# Patient Record
Sex: Female | Born: 1990 | Race: Black or African American | Hispanic: No | Marital: Single | Smoking: Never smoker
Health system: Southern US, Community
[De-identification: ages and names within clinical notes are randomized; demographics above are authoritative.]

## PROBLEM LIST (undated history)

## (undated) HISTORY — PX: SHOULDER SURGERY: SHX246

---

## 2016-09-18 ENCOUNTER — Emergency Department
Admission: EM | Admit: 2016-09-18 | Discharge: 2016-09-18 | Disposition: A | Discharge status: Home / Self Care | Payer: 59 | Attending: Emergency Medicine | Admitting: Emergency Medicine

## 2016-09-18 ENCOUNTER — Encounter: Payer: Self-pay | Admitting: Emergency Medicine

## 2016-09-18 DIAGNOSIS — B349 Viral infection, unspecified: Secondary | ICD-10-CM | POA: Diagnosis not present

## 2016-09-18 DIAGNOSIS — R05 Cough: Secondary | ICD-10-CM | POA: Diagnosis present

## 2016-09-18 MED ORDER — IBUPROFEN 600 MG PO TABS: 600.0000 mg | ORAL_TABLET | Freq: Four times a day (QID) | ORAL | 0 refills | Status: DC | PRN

## 2016-09-18 MED ORDER — PROMETHAZINE-CODEINE 6.25-10 MG/5ML PO SYRP: 5.0000 mL | ORAL_SOLUTION | Freq: Four times a day (QID) | ORAL | 0 refills | Status: AC | PRN

## 2016-09-18 NOTE — ED Notes (Signed)
See triage note.states she developed body aches about 4 days ago  Low grade fever on Sunday  Afebrile on arrival  Also has had cough   Min relief of headache with tylenol

## 2016-09-18 NOTE — ED Provider Notes (Signed)
[facility] Emergency Department Provider Note  ____________________________________________  Time seen: Approximately 7:41 AM  I have reviewed the triage vital signs and the nursing notes.   HISTORY  Chief Complaint Flu like symptoms   HPI Loretta Keller is a 26 y.o. female who presents to the emergency department for evaluation of bodyaches, cough, and headache for the past 4 days.No relief with over the counter medications.   History reviewed. No pertinent past medical history.  There are no active problems to display for this patient.   No past surgical history on file.  Prior to Admission medications   Medication Sig Start Date End Date Taking? Authorizing Provider  ibuprofen (ADVIL,MOTRIN) 600 MG tablet Take 1 tablet (600 mg total) by mouth every 6 (six) hours as needed. 09/18/16   Chinita Pesterari B Emmylou Bieker, FNP  promethazine-codeine (PHENERGAN WITH CODEINE) 6.25-10 MG/5ML syrup Take 5 mLs by mouth every 6 (six) hours as needed for cough. 09/18/16   Chinita Pesterari B Mylan Schwarz, FNP    Allergies Patient has no known allergies.  No family history on file.  Social History Social History  Substance Use Topics  . Smoking status: Not on file  . Smokeless tobacco: Not on file  . Alcohol use Not on file    Review of Systems Constitutional: No fever/chills ENT: Negative for sore throat. Cardiovascular: Denies chest pain. Respiratory: Negative for shortness of breath. Positive for cough. Gastrointestinal: Negative for nausea,  Negative for vomiting.  Negative for diarrhea.  Musculoskeletal: Positive for body aches Skin: Negative for rash. Neurological: Positive for headaches ____________________________________________   PHYSICAL EXAM:  VITAL SIGNS: ED Triage Vitals  Enc Vitals Group     BP 09/18/16 0734 (!) 138/91     Pulse Rate 09/18/16 0734 85     Resp 09/18/16 0734 18     Temp 09/18/16 0734 98.4 F (36.9 C)     Temp Source 09/18/16 0734 Oral   SpO2 09/18/16 0734 97 %     Weight 09/18/16 0730 215 lb (97.5 kg)     Height 09/18/16 0730 5\' 3"  (1.6 m)     Head Circumference --      Peak Flow --      Pain Score 09/18/16 0730 6     Pain Loc --      Pain Edu? --      Excl. in GC? --     Constitutional: Alert and oriented. Well appearing and in no acute distress. Eyes: Conjunctivae are normal. EOMI. Ears: Bilateral TM normal Nose: Sinus congestion noted; no rhinnorhea. Mouth/Throat: Mucous membranes are moist.  Oropharynx mildly erythematous. Tonsils without edema or exudate. Neck: No stridor.  Lymphatic: No cervical lymphadenopathy. Cardiovascular: Normal rate, regular rhythm. Good peripheral circulation. Respiratory: Normal respiratory effort.  No retractions. Breath sounds clear. Gastrointestinal: Soft and nontender.  Musculoskeletal: FROM x 4 extremities.  Neurologic:  Normal speech and language.  Skin:  Skin is warm, dry and intact. No rash noted. Psychiatric: Mood and affect are normal. Speech and behavior are normal.  ____________________________________________   LABS (all labs ordered are listed, but only abnormal results are displayed)  Labs Reviewed - No data to display ____________________________________________  EKG   ____________________________________________  RADIOLOGY  Not indicated. ____________________________________________   PROCEDURES  Procedure(s) performed: None  Critical Care performed: No ____________________________________________   INITIAL IMPRESSION / ASSESSMENT AND PLAN / ED COURSE  26 year old female presenting to the emergency department for management of URI symptoms. She'll be given a prescription for fever with codeine and  ibuprofen. She was instructed to follow-up with the primary care provider for choice for symptoms that are not improving over the next 2 days. She was instructed to return to the emergency department for symptoms that change or worsen if she is unable  schedule an appointment.  Pertinent labs & imaging results that were available during my care of the patient were reviewed by me and considered in my medical decision making (see chart for details).  Discharge Medication List as of 09/18/2016  8:00 AM    START taking these medications   Details  ibuprofen (ADVIL,MOTRIN) 600 MG tablet Take 1 tablet (600 mg total) by mouth every 6 (six) hours as needed., Starting Wed 09/18/2016, Print    promethazine-codeine (PHENERGAN WITH CODEINE) 6.25-10 MG/5ML syrup Take 5 mLs by mouth every 6 (six) hours as needed for cough., Starting Wed 09/18/2016, Print        If controlled substance prescribed during this visit, 12 month history viewed on the [facility] prior to issuing an initial prescription for Schedule II or III opiod. ____________________________________________   FINAL CLINICAL IMPRESSION(S) / ED DIAGNOSES  Final diagnoses:  Viral syndrome    Note:  This document was prepared using Dragon voice recognition software and may include unintentional dictation errors.     Chinita Pester, FNP 09/20/16 1059    Emily Filbert, MD 09/23/16 [phone]

## 2016-09-18 NOTE — ED Triage Notes (Signed)
Pt reports body aches, cough and headache for four days. Pt in no apparent distress.

## 2017-01-13 ENCOUNTER — Encounter: Payer: Self-pay | Admitting: Emergency Medicine

## 2017-01-13 ENCOUNTER — Emergency Department: Payer: 59

## 2017-01-13 ENCOUNTER — Emergency Department
Admission: EM | Admit: 2017-01-13 | Discharge: 2017-01-13 | Disposition: A | Discharge status: Home / Self Care | Payer: 59 | Attending: Emergency Medicine | Admitting: Emergency Medicine

## 2017-01-13 DIAGNOSIS — X58XXXA Exposure to other specified factors, initial encounter: Secondary | ICD-10-CM | POA: Diagnosis not present

## 2017-01-13 DIAGNOSIS — Y999 Unspecified external cause status: Secondary | ICD-10-CM | POA: Diagnosis not present

## 2017-01-13 DIAGNOSIS — R42 Dizziness and giddiness: Secondary | ICD-10-CM | POA: Insufficient documentation

## 2017-01-13 DIAGNOSIS — R110 Nausea: Secondary | ICD-10-CM | POA: Diagnosis not present

## 2017-01-13 DIAGNOSIS — Y929 Unspecified place or not applicable: Secondary | ICD-10-CM | POA: Diagnosis not present

## 2017-01-13 DIAGNOSIS — S060X0A Concussion without loss of consciousness, initial encounter: Secondary | ICD-10-CM | POA: Diagnosis not present

## 2017-01-13 DIAGNOSIS — Y939 Activity, unspecified: Secondary | ICD-10-CM | POA: Diagnosis not present

## 2017-01-13 DIAGNOSIS — R51 Headache: Secondary | ICD-10-CM | POA: Diagnosis present

## 2017-01-13 DIAGNOSIS — G44209 Tension-type headache, unspecified, not intractable: Secondary | ICD-10-CM | POA: Diagnosis not present

## 2017-01-13 LAB — POCT PREGNANCY, URINE: PREG TEST UR: NEGATIVE

## 2017-01-13 MED ORDER — HYDROCODONE-ACETAMINOPHEN 5-325 MG PO TABS: 1.0000 | ORAL_TABLET | Freq: Four times a day (QID) | ORAL | 0 refills | Status: AC | PRN

## 2017-01-13 MED ORDER — ONDANSETRON 8 MG PO TBDP: 8.0000 mg | ORAL_TABLET | Freq: Three times a day (TID) | ORAL | 0 refills | Status: DC | PRN

## 2017-01-13 MED ORDER — ONDANSETRON 4 MG PO TBDP
4.0000 mg | ORAL_TABLET | Freq: Once | ORAL | Status: AC
  Administered 2017-01-13: 4 mg via ORAL
  Filled 2017-01-13: qty 1

## 2017-01-13 MED ORDER — KETOROLAC TROMETHAMINE 30 MG/ML IJ SOLN
30.0000 mg | Freq: Once | INTRAMUSCULAR | Status: AC
  Administered 2017-01-13: 30 mg via INTRAMUSCULAR
  Filled 2017-01-13: qty 1

## 2017-01-13 MED ORDER — CYCLOBENZAPRINE HCL 5 MG PO TABS: 5.0000 mg | ORAL_TABLET | Freq: Three times a day (TID) | ORAL | 0 refills | Status: AC | PRN
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
## 2017-01-17 ENCOUNTER — Ambulatory Visit (HOSPITAL_COMMUNITY)
Admission: EM | Admit: 2017-01-17 | Discharge: 2017-01-17 | Disposition: A | Discharge status: Home / Self Care | Payer: 59 | Attending: Internal Medicine | Admitting: Internal Medicine

## 2017-01-17 ENCOUNTER — Encounter (HOSPITAL_COMMUNITY): Payer: Self-pay | Admitting: Emergency Medicine

## 2017-01-17 DIAGNOSIS — W19XXXA Unspecified fall, initial encounter: Secondary | ICD-10-CM

## 2017-01-17 DIAGNOSIS — G44209 Tension-type headache, unspecified, not intractable: Secondary | ICD-10-CM | POA: Diagnosis not present

## 2017-01-17 DIAGNOSIS — S060X0A Concussion without loss of consciousness, initial encounter: Secondary | ICD-10-CM | POA: Diagnosis not present

## 2017-01-17 DIAGNOSIS — S060X0D Concussion without loss of consciousness, subsequent encounter: Secondary | ICD-10-CM

## 2017-01-17 MED ORDER — IBUPROFEN 800 MG PO TABS: 800.0000 mg | ORAL_TABLET | Freq: Three times a day (TID) | ORAL | 0 refills | Status: AC
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
## 2017-01-27 ENCOUNTER — Encounter (HOSPITAL_COMMUNITY): Payer: Self-pay | Admitting: Emergency Medicine

## 2017-01-27 ENCOUNTER — Emergency Department (HOSPITAL_COMMUNITY)
Admission: EM | Admit: 2017-01-27 | Discharge: 2017-01-27 | Disposition: A | Discharge status: Home / Self Care | Payer: 59 | Attending: Emergency Medicine | Admitting: Emergency Medicine

## 2017-01-27 DIAGNOSIS — R1110 Vomiting, unspecified: Secondary | ICD-10-CM | POA: Diagnosis present

## 2017-01-27 DIAGNOSIS — R51 Headache: Secondary | ICD-10-CM | POA: Diagnosis not present

## 2017-01-27 DIAGNOSIS — R197 Diarrhea, unspecified: Secondary | ICD-10-CM | POA: Diagnosis not present

## 2017-01-27 LAB — COMPREHENSIVE METABOLIC PANEL
ALT: 16 U/L (ref 14–54)
AST: 23 U/L (ref 15–41)
Albumin: 4.5 g/dL (ref 3.5–5.0)
Alkaline Phosphatase: 80 U/L (ref 38–126)
Anion gap: 8 (ref 5–15)
BUN: 9 mg/dL (ref 6–20)
CO2: 24 mmol/L (ref 22–32)
Calcium: 9.2 mg/dL (ref 8.9–10.3)
Chloride: 101 mmol/L (ref 101–111)
Creatinine, Ser: 0.78 mg/dL (ref 0.44–1.00)
GFR calc Af Amer: >60 mL/min (ref >60)
GFR calc non Af Amer: >60 mL/min (ref >60)
Glucose, Bld: 92 mg/dL (ref 65–99)
Potassium: 3.4 mmol/L — ABNORMAL LOW (ref 3.5–5.1)
Sodium: 133 mmol/L — ABNORMAL LOW (ref 135–145)
Total Bilirubin: 0.6 mg/dL (ref 0.3–1.2)
Total Protein: 7.9 g/dL (ref 6.5–8.1)

## 2017-01-27 LAB — URINALYSIS, ROUTINE W REFLEX MICROSCOPIC
Bilirubin Urine: NEGATIVE
Glucose, UA: NEGATIVE mg/dL
Hgb urine dipstick: NEGATIVE
Ketones, ur: 5 mg/dL — AB
Leukocytes, UA: NEGATIVE
Nitrite: NEGATIVE
Protein, ur: NEGATIVE mg/dL
Specific Gravity, Urine: 1.026 (ref 1.005–1.030)
pH: 5 (ref 5.0–8.0)

## 2017-01-27 LAB — CBC
HCT: 39.7 % (ref 36.0–46.0)
Hemoglobin: 12.9 g/dL (ref 12.0–15.0)
MCH: 26.4 pg (ref 26.0–34.0)
MCHC: 32.5 g/dL (ref 30.0–36.0)
MCV: 81.2 fL (ref 78.0–100.0)
Platelets: 298 10*3/uL (ref 150–400)
RBC: 4.89 MIL/uL (ref 3.87–5.11)
RDW: 13.1 % (ref 11.5–15.5)
WBC: 6.1 10*3/uL (ref 4.0–10.5)

## 2017-01-27 LAB — LIPASE, BLOOD: Lipase: 27 U/L (ref 11–51)
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
## 2017-01-27 NOTE — ED Notes (Signed)
Pt states that she is unable to stay due to wait times, pt states she feels better is an going to take some pepto. Pt states she will return if she begins to feel worse

## 2017-01-28 ENCOUNTER — Encounter (HOSPITAL_COMMUNITY): Payer: Self-pay | Admitting: Vascular Surgery

## 2017-01-28 ENCOUNTER — Emergency Department (HOSPITAL_COMMUNITY)
Admission: EM | Admit: 2017-01-28 | Discharge: 2017-01-28 | Disposition: A | Discharge status: Home / Self Care | Payer: 59 | Attending: Emergency Medicine | Admitting: Emergency Medicine

## 2017-01-28 DIAGNOSIS — Z79899 Other long term (current) drug therapy: Secondary | ICD-10-CM | POA: Insufficient documentation

## 2017-01-28 DIAGNOSIS — R1084 Generalized abdominal pain: Secondary | ICD-10-CM | POA: Diagnosis present

## 2017-01-28 DIAGNOSIS — R112 Nausea with vomiting, unspecified: Secondary | ICD-10-CM | POA: Diagnosis not present

## 2017-01-28 DIAGNOSIS — Z7983 Long term (current) use of bisphosphonates: Secondary | ICD-10-CM | POA: Diagnosis not present

## 2017-01-28 DIAGNOSIS — K529 Noninfective gastroenteritis and colitis, unspecified: Secondary | ICD-10-CM | POA: Diagnosis not present

## 2017-01-28 DIAGNOSIS — Z791 Long term (current) use of non-steroidal anti-inflammatories (NSAID): Secondary | ICD-10-CM | POA: Insufficient documentation

## 2017-01-28 LAB — CBC
HEMATOCRIT: 36.7 % (ref 36.0–46.0)
Hemoglobin: 12 g/dL (ref 12.0–15.0)
MCH: 26.7 pg (ref 26.0–34.0)
MCHC: 32.7 g/dL (ref 30.0–36.0)
MCV: 81.6 fL (ref 78.0–100.0)
PLATELETS: 274 10*3/uL (ref 150–400)
RBC: 4.5 MIL/uL (ref 3.87–5.11)
RDW: 13.1 % (ref 11.5–15.5)
WBC: 3.8 10*3/uL — AB (ref 4.0–10.5)

## 2017-01-28 LAB — I-STAT BETA HCG BLOOD, ED (MC, WL, AP ONLY): I-stat hCG, quantitative: <5 m[IU]/mL (ref <5)

## 2017-01-28 LAB — URINALYSIS, ROUTINE W REFLEX MICROSCOPIC
BILIRUBIN URINE: NEGATIVE
Glucose, UA: NEGATIVE mg/dL
Hgb urine dipstick: NEGATIVE
KETONES UR: NEGATIVE mg/dL
Nitrite: NEGATIVE
PH: 5 (ref 5.0–8.0)
Protein, ur: NEGATIVE mg/dL
Specific Gravity, Urine: 1.018 (ref 1.005–1.030)

## 2017-01-28 LAB — COMPREHENSIVE METABOLIC PANEL
ALT: 15 U/L (ref 14–54)
ANION GAP: 7 (ref 5–15)
AST: 21 U/L (ref 15–41)
Albumin: 3.8 g/dL (ref 3.5–5.0)
Alkaline Phosphatase: 70 U/L (ref 38–126)
BUN: 8 mg/dL (ref 6–20)
CHLORIDE: 107 mmol/L (ref 101–111)
CO2: 23 mmol/L (ref 22–32)
Calcium: 8.9 mg/dL (ref 8.9–10.3)
Creatinine, Ser: 0.88 mg/dL (ref 0.44–1.00)
Glucose, Bld: 112 mg/dL — ABNORMAL HIGH (ref 65–99)
POTASSIUM: 3.3 mmol/L — AB (ref 3.5–5.1)
Sodium: 137 mmol/L (ref 135–145)
Total Bilirubin: 0.5 mg/dL (ref 0.3–1.2)
Total Protein: 7 g/dL (ref 6.5–8.1)

## 2017-01-28 LAB — LIPASE, BLOOD: LIPASE: 28 U/L (ref 11–51)

## 2017-01-28 MED ORDER — KETOROLAC TROMETHAMINE 30 MG/ML IJ SOLN
30.0000 mg | Freq: Once | INTRAMUSCULAR | Status: AC
  Administered 2017-01-28: 30 mg via INTRAVENOUS
  Filled 2017-01-28: qty 1

## 2017-01-28 MED ORDER — SODIUM CHLORIDE 0.9 % IV BOLUS (SEPSIS)
1000.0000 mL | Freq: Once | INTRAVENOUS | Status: AC
  Administered 2017-01-28: 1000 mL via INTRAVENOUS

## 2017-01-28 MED ORDER — ONDANSETRON 8 MG PO TBDP: ORAL_TABLET | ORAL | 0 refills | Status: AC

## 2017-01-28 MED ORDER — ONDANSETRON HCL 4 MG/2ML IJ SOLN
4.0000 mg | Freq: Once | INTRAMUSCULAR | Status: AC
  Administered 2017-01-28: 4 mg via INTRAVENOUS
  Filled 2017-01-28: qty 2

## 2017-01-28 NOTE — ED Triage Notes (Signed)
Pt reports to the ED for eval of abd pain, N/V, and diarrhea since Saturday morning. She thinks she may have gotten food poison. She was seen yesterday but left after triage. She denies any emesis at this time, just reports diarrhea and abd pain.

## 2017-01-28 NOTE — ED Notes (Signed)
Pt ambulated to room from waiting area with no complaints. Pt ambulated with a steady gait. Pt notified of need for urine sample, pt then assisted to restroom.

## 2017-01-28 NOTE — Discharge Instructions (Signed)
Zofran as prescribed as needed for nausea. ° °Clear liquid diet for the next 12 hours, then slowly advance to normal. ° °Return to the emergency department if you develop worsening pain, high fevers, bloody stools, or other new and concerning symptoms. °

## 2017-01-28 NOTE — ED Provider Notes (Signed)
[facility]-EMERGENCY DEPT Provider Note   CSN: [id number] Arrival date & time: 01/28/17  1116     History   Chief Complaint Chief Complaint  Patient presents with  . Abdominal Pain    HPI Ebonye Bascom LevelsFrazier is a 26 y.o. female.  Patient is a 26 year old female with complaints of abdominal cramping, nausea, vomiting, and diarrhea for the past 2 days. This started after eating chicken and she believe she may have food poisoning. She denies any bloody stool or vomit. She denies any fevers or chills. She denies any ill contacts.   The history is provided by the patient.  Abdominal Pain   This is a new problem. The current episode started 2 days ago. The problem occurs constantly. The problem has not changed since onset.The pain is located in the generalized abdominal region. The quality of the pain is cramping. The pain is moderate. Associated symptoms include nausea and vomiting. Pertinent negatives include fever, hematochezia, melena and dysuria. Nothing aggravates the symptoms. Nothing relieves the symptoms.    History reviewed. No pertinent past medical history.  There are no active problems to display for this patient.   History reviewed. No pertinent surgical history.  OB History    No data available       Home Medications    Prior to Admission medications   Medication Sig Start Date End Date Taking? Authorizing Provider  cyclobenzaprine (FLEXERIL) 5 MG tablet Take 1 tablet (5 mg total) by mouth 3 (three) times daily as needed. 01/13/17   Little, Traci M, PA-C  HYDROcodone-acetaminophen (NORCO/VICODIN) 5-325 MG tablet Take 1 tablet by mouth every 6 (six) hours as needed for moderate pain. 01/13/17   Little, Traci M, PA-C  ibuprofen (ADVIL,MOTRIN) 600 MG tablet Take 1 tablet (600 mg total) by mouth every 6 (six) hours as needed. 09/18/16   Triplett, Rulon Eisenmengerari B, FNP  ibuprofen (ADVIL,MOTRIN) 800 MG tablet Take 1 tablet (800 mg total) by mouth 3 (three) times daily. 01/17/17   Deatra Canterxford,  William J, FNP  ondansetron (ZOFRAN ODT) 8 MG disintegrating tablet Take 1 tablet (8 mg total) by mouth every 8 (eight) hours as needed for nausea or vomiting. 01/13/17   Little, Traci M, PA-C  promethazine-codeine (PHENERGAN WITH CODEINE) 6.25-10 MG/5ML syrup Take 5 mLs by mouth every 6 (six) hours as needed for cough. 09/18/16   Chinita Pesterriplett, Cari B, FNP    Family History No family history on file.  Social History Social History  Substance Use Topics  . Smoking status: Never Smoker  . Smokeless tobacco: Never Used  . Alcohol use No     Allergies   Patient has no known allergies.   Review of Systems Review of Systems  Constitutional: Negative for fever.  Gastrointestinal: Positive for abdominal pain, nausea and vomiting. Negative for hematochezia and melena.  Genitourinary: Negative for dysuria.  All other systems reviewed and are negative.    Physical Exam Updated Vital Signs BP 128/67 (BP Location: Left Arm)   Pulse 81   Temp 98.4 F (36.9 C) (Oral)   Resp 16   Ht 5\' 4"  (1.626 m)   Wt 102.5 kg (226 lb)   LMP 01/06/2017   SpO2 99%   BMI 38.79 kg/m   Physical Exam  Constitutional: She is oriented to person, place, and time. She appears well-developed and well-nourished. No distress.  HENT:  Head: Normocephalic and atraumatic.  Mouth/Throat: Oropharynx is clear and moist.  Neck: Normal range of motion. Neck supple.  Cardiovascular: Normal rate and regular  rhythm.  Exam reveals no gallop and no friction rub.   No murmur heard. Pulmonary/Chest: Effort normal and breath sounds normal. No respiratory distress. She has no wheezes.  Abdominal: Soft. Bowel sounds are normal. She exhibits no distension. There is tenderness.  There is generalized tenderness to palpation, however no focal tenderness, rebound, or guarding.  Musculoskeletal: Normal range of motion.  Neurological: She is alert and oriented to person, place, and time.  Skin: Skin is warm and dry. She is not  diaphoretic.  Nursing note and vitals reviewed.    ED Treatments / Results  Labs (all labs ordered are listed, but only abnormal results are displayed) Labs Reviewed  CBC - Abnormal; Notable for the following:       Result Value   WBC 3.8 (*)    All other components within normal limits  URINALYSIS, ROUTINE W REFLEX MICROSCOPIC - Abnormal; Notable for the following:    APPearance CLOUDY (*)    Leukocytes, UA LARGE (*)    Bacteria, UA RARE (*)    Squamous Epithelial / LPF 6-30 (*)    All other components within normal limits  LIPASE, BLOOD  COMPREHENSIVE METABOLIC PANEL  I-STAT BETA HCG BLOOD, ED ([facility], [facility], [facility] ONLY)    EKG  EKG Interpretation None       Radiology No results found.  Procedures Procedures (including critical care time)  Medications Ordered in ED Medications  sodium chloride 0.9 % bolus 1,000 mL (not administered)  ondansetron (ZOFRAN) injection 4 mg (not administered)  ketorolac (TORADOL) 30 MG/ML injection 30 mg (not administered)     Initial Impression / Assessment and Plan / ED Course  I have reviewed the triage vital signs and the nursing notes.  Pertinent labs & imaging results that were available during my care of the patient were reviewed by me and considered in my medical decision making (see chart for details).  Patient is a 27 year old female presenting with abdominal cramping, nausea, vomiting, diarrhea for the past 2 days. All has been nonbloody and non-bilious.  Her abdominal exam is benign and laboratory studies are reassuring. There is no elevation of white count. Her presentation is most consistent with a viral gastroenteritis. She is feeling better after IV fluids and medications here in the ER. She will be discharged, to return as needed for any problems.  Final Clinical Impressions(s) / ED Diagnoses   Final diagnoses:  None    New Prescriptions New Prescriptions   No medications on file     Geoffery Lyons, MD 01/28/17  1354

## 2017-03-28 ENCOUNTER — Emergency Department (HOSPITAL_COMMUNITY): Admission: EM | Admit: 2017-03-28 | Discharge: 2017-03-28 | Discharge status: Absent without leave | Payer: 59
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
## 2023-09-04 ENCOUNTER — Other Ambulatory Visit: Payer: Self-pay

## 2023-09-04 ENCOUNTER — Encounter (HOSPITAL_BASED_OUTPATIENT_CLINIC_OR_DEPARTMENT_OTHER): Payer: Self-pay | Admitting: Emergency Medicine

## 2023-09-04 ENCOUNTER — Emergency Department (HOSPITAL_BASED_OUTPATIENT_CLINIC_OR_DEPARTMENT_OTHER)
Admission: EM | Admit: 2023-09-04 | Discharge: 2023-09-04 | Disposition: A | Discharge status: Home / Self Care | Payer: No Typology Code available for payment source | Attending: Emergency Medicine | Admitting: Emergency Medicine

## 2023-09-04 DIAGNOSIS — J3489 Other specified disorders of nose and nasal sinuses: Secondary | ICD-10-CM | POA: Diagnosis not present

## 2023-09-04 DIAGNOSIS — W228XXA Striking against or struck by other objects, initial encounter: Secondary | ICD-10-CM | POA: Insufficient documentation

## 2023-09-04 DIAGNOSIS — S0993XA Unspecified injury of face, initial encounter: Secondary | ICD-10-CM | POA: Insufficient documentation

## 2023-09-04 DIAGNOSIS — K130 Diseases of lips: Secondary | ICD-10-CM | POA: Diagnosis not present

## 2023-09-04 MED ORDER — NAPROXEN 250 MG PO TABS
500.0000 mg | ORAL_TABLET | Freq: Once | ORAL | Status: AC
  Administered 2023-09-04: 500 mg via ORAL
  Filled 2023-09-04: qty 2
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
